# Patient Record
Sex: Female | Born: 2000
Health system: Southern US, Community
[De-identification: ages and names within clinical notes are randomized; demographics above are authoritative.]

## PROBLEM LIST (undated history)

## (undated) DIAGNOSIS — D689 Coagulation defect, unspecified: Secondary | ICD-10-CM

## (undated) DIAGNOSIS — F32A Depression, unspecified: Secondary | ICD-10-CM

## (undated) DIAGNOSIS — T7840XA Allergy, unspecified, initial encounter: Secondary | ICD-10-CM

## (undated) DIAGNOSIS — F419 Anxiety disorder, unspecified: Secondary | ICD-10-CM

## (undated) DIAGNOSIS — T148XXA Other injury of unspecified body region, initial encounter: Secondary | ICD-10-CM

## (undated) DIAGNOSIS — D649 Anemia, unspecified: Secondary | ICD-10-CM

## (undated) HISTORY — DX: Depression, unspecified: F32.A

## (undated) HISTORY — DX: Allergy, unspecified, initial encounter: T78.40XA

## (undated) HISTORY — DX: Anxiety disorder, unspecified: F41.9

## (undated) HISTORY — DX: Anemia, unspecified: D64.9

## (undated) HISTORY — PX: NOSE SURGERY: SHX723

## (undated) HISTORY — DX: Coagulation defect, unspecified: D68.9

---
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
## 2021-12-31 ENCOUNTER — Ambulatory Visit: Payer: Managed Care, Other (non HMO) | Admitting: Physician Assistant

## 2021-12-31 ENCOUNTER — Ambulatory Visit (INDEPENDENT_AMBULATORY_CARE_PROVIDER_SITE_OTHER): Payer: Managed Care, Other (non HMO) | Admitting: Family

## 2021-12-31 ENCOUNTER — Encounter: Payer: Self-pay | Admitting: Family

## 2021-12-31 VITALS — BP 103/67 | HR 71 | Temp 97.6°F | Ht 62.0 in | Wt 122.6 lb

## 2021-12-31 DIAGNOSIS — R000 Tachycardia, unspecified: Secondary | ICD-10-CM | POA: Diagnosis not present

## 2021-12-31 DIAGNOSIS — D693 Immune thrombocytopenic purpura: Secondary | ICD-10-CM | POA: Insufficient documentation

## 2021-12-31 NOTE — Patient Instructions (Signed)
Welcome to [facility], It was a pleasure meeting you today!    I have sent a referral to Cardiology for the provider you requested, they will call you directly to schedule.  Have a wonderful holiday!    PLEASE NOTE: If you had any LAB tests please let us know if you have not heard back within a few days. You may see your results on MyChart before we have a chance to review them but we will give you a call once they are reviewed by [place]. If we ordered any REFERRALS today, please let us know if you have not heard from their office within the next week.  Let us know through MyChart if you are needing REFILLS, or have your pharmacy send [place] the request. You can also use MyChart to communicate with me or any office staff.  Please try these tips to maintain a healthy lifestyle: It is important that you exercise regularly at least 30 minutes 5 times a week. Think about what you will eat, plan ahead. Choose whole foods, & think  "clean, green, fresh or frozen" over canned, processed or packaged foods which are more sugary, salty, and fatty. 70 to 75% of food eaten should be fresh vegetables and protein. 2-3  meals daily with healthy snacks between meals, but must be whole fruit, protein or vegetables. Aim to eat over a 10 hour period when you are active, for example, 7am to 5pm, and then STOP after your last meal of the day, drinking only water.  Shorter eating windows, 6-8 hours, are showing benefits in heart disease and blood sugar regulation. Drink water every day! Shoot for 64 ounces daily = 8 cups, no other drink is as healthy! Fruit juice is best enjoyed in a healthy way, by EATING the fruit.

## 2021-12-31 NOTE — Progress Notes (Signed)
New Patient Office Visit  Subjective:  Patient ID: Amy Shepherd, female    DOB: [birth year]  Age: 21 y.o. MRN: [id number]  CC:  Chief Complaint  Patient presents with   New Patient (Initial Visit)   Palpitations    Pt c/o increased HR when exercising and she gets chest pains, Present for over a year. Heart rate is around 210 when running.  Chest pains are only when she exercises. Runs, club soccer and weight lifting. Referral to cardiology.     HPI Amy Shepherd presents for establishing care today.  Palpitations & Tachycardia:  reports sx starting about a year ago, pt has been active for years in soccer and cross country running, and never had any problem, but then started having palpitations & tachycardia when running, sometimes also if walking up a lot of stairs, but not with rest or walking.  She states it resolves anywhere b/w 5-14min, also sometimes feels lightheaded w/chest tightness, but denies SOB, CP, dizziness, or nausea. Reports going to a medical center in [place] where she was in college and they did an ECG and had her wear a monitor when she exercised, but then it was canceled (unsure why) so there are no results.  Assessment & Plan:   Problem List Items Addressed This Visit       Other   Exercise-induced tachycardia - Primary    chronic, started about a year ago pt without sx in office, only exercise induced hx of anemia & ITP, sees [facility] sending referral f/u prn      Relevant Orders   Ambulatory referral to Cardiology    Subjective:    Outpatient Medications Prior to Visit  Medication Sig Dispense Refill   amphetamine-dextroamphetamine (ADDERALL) 10 MG tablet Take 10 mg by mouth daily with breakfast.     levonorgestrel (KYLEENA) 19.5 MG IUD 1 each by Intrauterine route once.     VYVANSE 10 MG capsule Take 10 mg by mouth every morning.     No facility-administered medications prior to visit.   Past Medical History:  Diagnosis Date   Allergy     Anemia    Anxiety    Bone bruise 03/2015   right knee   Clotting disorder [facility])    Depression    Syndesmotic ankle sprain, right, initial encounter 04/22/2017   Past Surgical History:  Procedure Laterality Date   NOSE SURGERY     broken Aug, 2017   NOSE SURGERY     2017    Objective:   Today's Vitals: BP 103/67 (BP Location: Left Arm, Patient Position: Sitting, Cuff Size: Large)   Pulse 71   Temp 97.6 F (36.4 C) (Temporal)   Ht 5\' 2"  (1.575 m)   Wt 122 lb 9.6 oz (55.6 kg)   LMP 12/17/2021 (Approximate)   SpO2 99%   BMI 22.42 kg/m   Physical Exam Vitals and nursing note reviewed.  Constitutional:      Appearance: Normal appearance.  Cardiovascular:     Rate and Rhythm: Normal rate and regular rhythm.  Pulmonary:     Effort: Pulmonary effort is normal.     Breath sounds: Normal breath sounds.  Musculoskeletal:        General: Normal range of motion.  Skin:    General: Skin is warm and dry.  Neurological:     Mental Status: She is alert.  Psychiatric:        Mood and Affect: Mood normal.        Behavior:  Behavior normal.     Dulce Sellar, NP

## 2021-12-31 NOTE — Assessment & Plan Note (Signed)
chronic, started about a year ago pt without sx in office, only exercise induced hx of anemia & ITP, sees [facility] sending referral f/u prn

## 2022-01-01 ENCOUNTER — Ambulatory Visit: Payer: Managed Care, Other (non HMO) | Attending: Internal Medicine | Admitting: Internal Medicine

## 2022-01-01 ENCOUNTER — Encounter: Payer: Self-pay | Admitting: Internal Medicine

## 2022-01-01 VITALS — BP 110/71 | HR 64 | Ht 64.0 in | Wt 124.4 lb

## 2022-01-01 DIAGNOSIS — I471 Supraventricular tachycardia, unspecified: Secondary | ICD-10-CM

## 2022-01-01 NOTE — Progress Notes (Signed)
Cardiology Office Note:    Date:  01/01/2022   ID:  Amy Shepherd, DOB 04/24/2000, MRN [id number]  PCP:  Dulce Sellar, NP   [facility] Providers Cardiologist:  None     Referring MD: Dulce Sellar, NP   No chief complaint on file. Palpitations  History of Present Illness:    Amy Shepherd is a 21 y.o. female with palpations with running. She denies syncope. She can have LH and CP. Notes rates go up to 210. She had anemia, she is on iron. She was diagnosed with ITP. She was managed at [facility]. Managed with steroids. She was on Rituxan. She is now off therapy.  No thyroid dx hx. Minimal caffeine. Takes adderall sparingly.  Family Hx: No cardiac dx. Father had a PE.  No family hx of SCD  Social Hx:no smoking, etoh. Plays soccer, weight lifting, and running  Past Medical History:  Diagnosis Date   Allergy    Anemia    Anxiety    Bone bruise 03/2015   right knee   Clotting disorder (HCC)    Depression    Syndesmotic ankle sprain, right, initial encounter 04/22/2017    Past Surgical History:  Procedure Laterality Date   NOSE SURGERY     broken Aug, 2017   NOSE SURGERY     2017    Current Medications: No outpatient medications have been marked as taking for the 01/01/22 encounter (Appointment) with Maisie Fus, MD.     Allergies:   Gluten meal and Seasonal ic [octacosanol]   Social History   Socioeconomic History   Marital status: Single    Spouse name: Not on file   Number of children: Not on file   Years of education: Not on file   Highest education level: Not on file  Occupational History   Not on file  Tobacco Use   Smoking status: Never   Smokeless tobacco: Never  Vaping Use   Vaping Use: Never used  Substance and Sexual Activity   Alcohol use: Yes    Alcohol/week: 1.0 standard drink of alcohol    Types: 1 Standard drinks or equivalent per week   Drug use: Never   Sexual activity: Yes    Birth control/protection: I.U.D.   Other Topics Concern   Not on file  Social History Narrative   Not on file   Social Determinants of Health   Financial Resource Strain: Not on file  Food Insecurity: Not on file  Transportation Needs: Not on file  Physical Activity: Not on file  Stress: Not on file  Social Connections: Not on file     Family History: The patient's family history includes Arthritis in her father and maternal grandfather; Clotting disorder in her father; Depression in her maternal grandmother and mother; Hearing loss in her father and maternal grandfather; Hyperlipidemia in her father and mother; Hypertension in her maternal grandfather; Pulmonary embolism in her father and paternal grandmother.  ROS:   Please see the history of present illness.     All other systems reviewed and are negative.  EKGs/Labs/Other Studies Reviewed:    The following studies were reviewed today:   EKG:  EKG is  ordered today.  The ekg ordered today demonstrates   01/01/2022-NSR with sinus arrhythmia  Recent Labs: No results found for requested labs within last 365 days.  Recent Lipid Panel No results found for: "CHOL", "TRIG", "HDL", "CHOLHDL", "VLDL", "LDLCALC", "LDLDIRECT"   Risk Assessment/Calculations:     Physical Exam:  VS:  Vitals:   01/01/22 1143  BP: 110/71  Pulse: 64  SpO2: 99%      LMP 12/17/2021 (Approximate)     Wt Readings from Last 3 Encounters:  12/31/21 122 lb 9.6 oz (55.6 kg)  04/22/17 107 lb (48.5 kg) (28 %, Z= -0.59)*  04/01/17 107 lb (48.5 kg) (28 %, Z= -0.58)*   * Growth percentiles are based on CDC (Girls, 2-20 Years) data.     GEN:  Well nourished, well developed in no acute distress HEENT: Normal NECK: No JVD; No carotid bruits LYMPHATICS: No lymphadenopathy CARDIAC: RRR, no murmurs, rubs, gallops RESPIRATORY:  Clear to auscultation without rales, wheezing or rhonchi  ABDOMEN: Soft, non-tender, non-distended MUSCULOSKELETAL:  No edema; No deformity  SKIN: Warm  and dry NEUROLOGIC:  Alert and oriented x 3 PSYCHIATRIC:  Normal affect   ASSESSMENT:   SVT: Notes rates up to 210. She does not have high risk features including syncope c/f arrhythmia , family hx of SCD, or abnormalities on her EKG. Can be short or long RP tachycardia. Discussed vagal maneuvers, carotid massages. Discussed to refrain from adderall  Will plan for ETT for provocation, TTE.   PLAN:    In order of problems listed above:  ETT TTE Follow up 3 months      Shared Decision Making/Informed Consent The risks [chest pain, shortness of breath, cardiac arrhythmias, dizziness, blood pressure fluctuations, myocardial infarction, stroke/transient ischemic attack, and life-threatening complications (estimated to be 1 in 10,000)], benefits (risk stratification, diagnosing coronary artery disease, treatment guidance) and alternatives of an exercise tolerance test were discussed in detail with Amy Shepherd and she agrees to proceed.    Medication Adjustments/Labs and Tests Ordered: Current medicines are reviewed at length with the patient today.  Concerns regarding medicines are outlined above.  No orders of the defined types were placed in this encounter.  No orders of the defined types were placed in this encounter.   There are no Patient Instructions on file for this visit.   Signed, Maisie Fus, MD  01/01/2022 10:24 AM    [facility]

## 2022-01-01 NOTE — Patient Instructions (Signed)
Medication Instructions:  No changes *If you need a refill on your cardiac medications before your next appointment, please call your pharmacy*   Lab Work: No labs If you have labs (blood work) drawn today and your tests are completely normal, you will receive your results only by: MyChart Message (if you have MyChart) OR A paper copy in the mail If you have any lab test that is abnormal or we need to change your treatment, we will call you to review the results.   Testing/Procedures: [address], [address] Your physician has requested that you have an echocardiogram. Echocardiography is a painless test that uses sound waves to create images of your heart. It provides your doctor with information about the size and shape of your heart and how well your heart's chambers and valves are working. This procedure takes approximately one hour. There are no restrictions for this procedure. Please do NOT wear cologne, perfume, aftershave, or lotions (deodorant is allowed). Please arrive 15 minutes prior to your appointment time.    [address] , [address] Your physician has requested that you have en exercise stress myoview. For further information please visit [email or web address] Please follow instruction sheet, as given.   Follow-Up: At [facility], you and your health needs are our priority.  As part of our continuing mission to provide you with exceptional heart care, we have created designated Provider Care Teams.  These Care Teams include your primary Cardiologist (physician) and Advanced Practice Providers (APPs -  Physician Assistants and Nurse Practitioners) who all work together to provide you with the care you need, when you need it.  We recommend signing up for the patient portal called "MyChart".  Sign up information is provided on this After Visit Summary.  MyChart is used to connect with patients for Virtual Visits (Telemedicine).  Patients are able  to view lab/test results, encounter notes, upcoming appointments, etc.  Non-urgent messages can be sent to your provider as well.   To learn more about what you can do with MyChart, go to [email or web address].    Your next appointment:   3 month(s)  The format for your next appointment:   In Person  Provider:   Maisie Fus, MD

## 2022-02-06 ENCOUNTER — Telehealth (HOSPITAL_COMMUNITY): Payer: Self-pay | Admitting: *Deleted

## 2022-02-06 NOTE — Telephone Encounter (Signed)
Spoke with patient and she was given instructions about her scheduled ETT on 02/07/22 at 3:30 pm.

## 2022-02-07 ENCOUNTER — Ambulatory Visit (HOSPITAL_BASED_OUTPATIENT_CLINIC_OR_DEPARTMENT_OTHER): Payer: Managed Care, Other (non HMO)

## 2022-02-07 ENCOUNTER — Ambulatory Visit (HOSPITAL_COMMUNITY): Payer: Managed Care, Other (non HMO) | Attending: Internal Medicine

## 2022-02-07 DIAGNOSIS — I471 Supraventricular tachycardia, unspecified: Secondary | ICD-10-CM | POA: Insufficient documentation

## 2022-02-07 LAB — EXERCISE TOLERANCE TEST
Angina Index: 0
Duke Treadmill Score: 10
Estimated workload: 11.9
Exercise duration (min): 10 min
Exercise duration (sec): 9 s
MPHR: 200 {beats}/min
Peak HR: 206 {beats}/min
Percent HR: 103 %
Rest HR: 73 {beats}/min
ST Depression (mm): 0 mm

## 2022-02-09 LAB — ECHOCARDIOGRAM COMPLETE
Area-P 1/2: 3.57 cm2
S' Lateral: 2.8 cm

## 2022-02-10 ENCOUNTER — Encounter: Payer: Self-pay | Admitting: Internal Medicine

## 2022-02-12 MED ORDER — PROPRANOLOL HCL 20 MG PO TABS: 20.0000 mg | ORAL_TABLET | Freq: Two times a day (BID) | ORAL | 1 refills | Status: DC | PRN

## 2022-02-20 ENCOUNTER — Other Ambulatory Visit: Payer: Self-pay | Admitting: Internal Medicine

## 2022-03-20 ENCOUNTER — Other Ambulatory Visit: Payer: Self-pay | Admitting: Internal Medicine

## 2022-03-28 ENCOUNTER — Ambulatory Visit: Payer: Managed Care, Other (non HMO) | Admitting: Internal Medicine

## 2022-04-21 ENCOUNTER — Ambulatory Visit: Payer: Managed Care, Other (non HMO) | Admitting: Internal Medicine

## 2022-05-09 ENCOUNTER — Encounter: Payer: Self-pay | Admitting: Internal Medicine

## 2022-05-12 ENCOUNTER — Encounter: Payer: Self-pay | Admitting: Internal Medicine

## 2022-05-12 ENCOUNTER — Ambulatory Visit: Payer: Managed Care, Other (non HMO) | Attending: Internal Medicine | Admitting: Internal Medicine

## 2022-05-12 VITALS — BP 98/58 | HR 86 | Ht 64.0 in | Wt 127.8 lb

## 2022-05-12 DIAGNOSIS — I471 Supraventricular tachycardia, unspecified: Secondary | ICD-10-CM

## 2022-05-12 MED ORDER — PROPRANOLOL HCL 10 MG PO TABS: 10.0000 mg | ORAL_TABLET | Freq: Two times a day (BID) | ORAL | 3 refills | Status: AC

## 2022-05-12 NOTE — Progress Notes (Addendum)
Cardiology Office Note:    Date:  05/12/2022   ID:  Amy Shepherd, DOB [birth year], MRN [id number]  PCP:  Dulce Sellar, NP   [facility] Providers Cardiologist:  Maisie Fus, MD     Referring MD: Dulce Sellar, NP   No chief complaint on file. Palpitations  History of Present Illness:    Amy Shepherd is a 22 y.o. female with palpations with running. She denies syncope. She can have LH and CP. Notes rates go up to 210. She had anemia, she is on iron. She was diagnosed with ITP. She was managed at [facility]. Managed with steroids. She was on Rituxan. She is now off therapy.  No thyroid dx hx. Minimal caffeine. Takes adderall sparingly.  Family Hx: No cardiac dx. Father had a PE.  No family hx of SCD  Social Hx:no smoking, etoh. Plays soccer, weight lifting, and running   Interim hx 05/11/2022 She notes if she stops the propanolol, he heart rates increase.  She wears her apple watch.  Notes is fatigued with propanolol. She notes fatigue issues prior to this. She is still playing soccer.    Cardiology Studies ETT 02/07/2022- 10 mins, 11 METs.  Tachycardiac 10 minutes into recovery [ she denies adderal use] TTE 02/07/2022- normal  Past Medical History:  Diagnosis Date   Allergy    Anemia    Anxiety    Bone bruise 03/2015   right knee   Clotting disorder [facility]    Depression    Syndesmotic ankle sprain, right, initial encounter 04/22/2017    Past Surgical History:  Procedure Laterality Date   NOSE SURGERY     broken Aug, 2017   NOSE SURGERY     2017    Current Medications: Current Meds  Medication Sig   amphetamine-dextroamphetamine (ADDERALL) 10 MG tablet Take 10 mg by mouth daily with breakfast.   levonorgestrel (KYLEENA) 19.5 MG IUD 1 each by Intrauterine route once.   propranolol (INDERAL) 10 MG tablet Take 1 tablet (10 mg total) by mouth 2 (two) times daily.   VYVANSE 10 MG capsule Take 10 mg by mouth every morning.   [DISCONTINUED]  propranolol (INDERAL) 20 MG tablet TAKE 1 TABLET BY MOUTH TWICE A DAY     Allergies:   Gluten meal and Seasonal ic [octacosanol]   Social History   Socioeconomic History   Marital status: Single    Spouse name: Not on file   Number of children: Not on file   Years of education: Not on file   Highest education level: Not on file  Occupational History   Not on file  Tobacco Use   Smoking status: Never   Smokeless tobacco: Never  Vaping Use   Vaping Use: Never used  Substance and Sexual Activity   Alcohol use: Yes    Alcohol/week: 1.0 standard drink of alcohol    Types: 1 Standard drinks or equivalent per week   Drug use: Never   Sexual activity: Yes    Birth control/protection: I.U.D.  Other Topics Concern   Not on file  Social History Narrative   Not on file   Social Determinants of Health   Financial Resource Strain: Not on file  Food Insecurity: Not on file  Transportation Needs: Not on file  Physical Activity: Not on file  Stress: Not on file  Social Connections: Not on file     Family History: The patient's family history includes Arthritis in her father and maternal grandfather; Clotting disorder in her  father; Depression in her maternal grandmother and mother; Hearing loss in her father and maternal grandfather; Hyperlipidemia in her father and mother; Hypertension in her maternal grandfather; Pulmonary embolism in her father and paternal grandmother.  ROS:   Please see the history of present illness.     All other systems reviewed and are negative.  EKGs/Labs/Other Studies Reviewed:    The following studies were reviewed today:   EKG:  EKG is  ordered today.  The ekg ordered today demonstrates   01/01/2022-NSR with sinus arrhythmia  Recent Labs: No results found for requested labs within last 365 days.   Recent Lipid Panel No results found for: "CHOL", "TRIG", "HDL", "CHOLHDL", "VLDL", "LDLCALC", "LDLDIRECT"   Risk Assessment/Calculations:      Physical Exam:    VS:  Vitals:   05/12/22 0807  BP: (!) 98/58  Pulse: 86  SpO2: 99%     Wt Readings from Last 3 Encounters:  05/12/22 127 lb 12.5 oz (58 kg)  02/07/22 124 lb (56.2 kg)  01/01/22 124 lb 6.4 oz (56.4 kg)    GEN:  Well nourished, well developed in no acute distress HEENT: Normal NECK: No JVD; No carotid bruits LYMPHATICS: No lymphadenopathy CARDIAC: RRR, no murmurs, rubs, gallops RESPIRATORY:  Clear to auscultation without rales, wheezing or rhonchi  ABDOMEN: Soft, non-tender, non-distended MUSCULOSKELETAL:  No edema; No deformity  SKIN: Warm and dry NEUROLOGIC:  Alert and oriented x 3 PSYCHIATRIC:  Normal affect   ASSESSMENT:   SVT/Inappropriate Sinus tachycardia: Notes rates up to 210. Her ETT showed sinus tachy cardiac 10 minutes into recovery. I started propanolol 20 mg BID, with fatigue can decrease to 10 mg BID   PLAN:    In order of problems listed above:  Change to propanolol 10 mg BID Follow up in 6 months       Medication Adjustments/Labs and Tests Ordered: Current medicines are reviewed at length with the patient today.  Concerns regarding medicines are outlined above.  No orders of the defined types were placed in this encounter.  Meds ordered this encounter  Medications   propranolol (INDERAL) 10 MG tablet    Sig: Take 1 tablet (10 mg total) by mouth 2 (two) times daily.    Dispense:  180 tablet    Refill:  3    Patient Instructions  Medication Instructions:  Your physician has recommended you make the following change in your medication:  DECREASE: Propranolol 10 mg twice daily *If you need a refill on your cardiac medications before your next appointment, please call your pharmacy*   Lab Work: None   Testing/Procedures: None   Follow-Up: At [facility], you and your health needs are our priority.  As part of our continuing mission to provide you with exceptional heart care, we have created designated  Provider Care Teams.  These Care Teams include your primary Cardiologist (physician) and Advanced Practice Providers (APPs -  Physician Assistants and Nurse Practitioners) who all work together to provide you with the care you need, when you need it.   Your next appointment:   6 month(s)  Provider:   Maisie Fus, MD     Signed, Maisie Fus, MD  05/12/2022 8:39 AM    [facility]

## 2022-05-12 NOTE — Patient Instructions (Signed)
Medication Instructions:  Your physician has recommended you make the following change in your medication:  DECREASE: Propranolol 10 mg twice daily *If you need a refill on your cardiac medications before your next appointment, please call your pharmacy*   Lab Work: None   Testing/Procedures: None   Follow-Up: At [facility], you and your health needs are our priority.  As part of our continuing mission to provide you with exceptional heart care, we have created designated Provider Care Teams.  These Care Teams include your primary Cardiologist (physician) and Advanced Practice Providers (APPs -  Physician Assistants and Nurse Practitioners) who all work together to provide you with the care you need, when you need it.   Your next appointment:   6 month(s)  Provider:   Maisie Fus, MD

## 2023-01-28 ENCOUNTER — Ambulatory Visit: Payer: Managed Care, Other (non HMO) | Admitting: Internal Medicine
